# Patient Record
Sex: Male | Born: 2005
Health system: Southern US, Community
[De-identification: ages and names within clinical notes are randomized; demographics above are authoritative.]

---
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
## 2023-12-15 ENCOUNTER — Telehealth: Payer: Self-pay | Admitting: Family

## 2023-12-15 NOTE — Telephone Encounter (Signed)
Mother aware pt needs a NEW PT apt.   Copied from CRM [id number]. Topic: Appointments - Scheduling Inquiry for Clinic >> Dec 15, 2023  3:12 PM Antony Haste wrote: This patient's mother is wanting to schedule an acute office visit for sometime this week with any provider, he has a sore throat and he is needing a doctor's note for work. However, [employer] is populating this message "Important No search results found between 1/29 and 2/2." Callback for Lelon Mast (Mother) -  [phone]
